# Patient Record
Sex: Female | Born: 1985 | Race: Black or African American | Hispanic: No | Marital: Single | Smoking: Current every day smoker
Health system: Southern US, Community
[De-identification: ages and names within clinical notes are randomized; demographics above are authoritative.]

## PROBLEM LIST (undated history)

## (undated) DIAGNOSIS — E669 Obesity, unspecified: Secondary | ICD-10-CM

---

## 2004-05-19 ENCOUNTER — Emergency Department: Payer: Self-pay | Admitting: Emergency Medicine

## 2006-11-02 ENCOUNTER — Emergency Department: Payer: Self-pay | Admitting: Emergency Medicine

## 2007-01-04 ENCOUNTER — Emergency Department (HOSPITAL_COMMUNITY): Admission: EM | Admit: 2007-01-04 | Discharge: 2007-01-04 | Payer: Self-pay | Admitting: Emergency Medicine

## 2007-01-05 ENCOUNTER — Emergency Department (HOSPITAL_COMMUNITY): Admission: EM | Admit: 2007-01-05 | Discharge: 2007-01-05 | Payer: Self-pay | Admitting: Emergency Medicine

## 2007-11-08 ENCOUNTER — Emergency Department: Payer: Self-pay | Admitting: Emergency Medicine

## 2014-04-25 ENCOUNTER — Encounter (HOSPITAL_COMMUNITY): Payer: Self-pay | Admitting: Emergency Medicine

## 2014-04-25 ENCOUNTER — Emergency Department (HOSPITAL_COMMUNITY)
Admission: EM | Admit: 2014-04-25 | Discharge: 2014-04-26 | Disposition: A | Discharge status: Home / Self Care | Payer: Self-pay | Attending: Emergency Medicine | Admitting: Emergency Medicine

## 2014-04-25 DIAGNOSIS — Z720 Tobacco use: Secondary | ICD-10-CM | POA: Insufficient documentation

## 2014-04-25 DIAGNOSIS — D649 Anemia, unspecified: Secondary | ICD-10-CM | POA: Insufficient documentation

## 2014-04-25 DIAGNOSIS — N939 Abnormal uterine and vaginal bleeding, unspecified: Secondary | ICD-10-CM | POA: Insufficient documentation

## 2014-04-25 LAB — CBC
HEMATOCRIT: 36.8 % (ref 36.0–46.0)
Hemoglobin: 11.7 g/dL — ABNORMAL LOW (ref 12.0–15.0)
MCH: 28.7 pg (ref 26.0–34.0)
MCHC: 31.8 g/dL (ref 30.0–36.0)
MCV: 90.4 fL (ref 78.0–100.0)
PLATELETS: 258 10*3/uL (ref 150–400)
RBC: 4.07 MIL/uL (ref 3.87–5.11)
RDW: 18.2 % — AB (ref 11.5–15.5)
WBC: 11.1 10*3/uL — ABNORMAL HIGH (ref 4.0–10.5)

## 2014-04-25 LAB — I-STAT BETA HCG BLOOD, ED (MC, WL, AP ONLY): I-stat hCG, quantitative: <5 m[IU]/mL (ref <5)

## 2014-04-25 MED ORDER — KETOROLAC TROMETHAMINE 60 MG/2ML IM SOLN
60.0000 mg | Freq: Once | INTRAMUSCULAR | Status: AC
  Administered 2014-04-25: 60 mg via INTRAMUSCULAR
  Filled 2014-04-25: qty 2

## 2014-04-25 NOTE — ED Notes (Signed)
Patient states that she has been bleeding since April 2, with large clots.  Patient states that she does not know if she is pregnant at this time.  Patient states that she is also having cramping with the bleeding.

## 2014-04-25 NOTE — ED Provider Notes (Signed)
CSN: [id number]     Arrival date & time 04/25/14  2109 History   First MD Initiated Contact with Patient 04/25/14 2229     Chief Complaint  Patient presents with  . Vaginal Bleeding     (Consider location/radiation/quality/duration/timing/severity/associated sxs/prior Treatment) HPI  Monica Mcbride is a 29 y.o. female with PMH of AUB presenting with vaginal bleeding for 2 weeks. Patient states she is passing large clots and having to change her pads frequently. Patient states she also has intermittent lower abdominal cramping but denies any abdominal pain at this time. She denies any nausea or vomiting. Last BM yesterday and normal. She denies any urinary symptoms. Patient denies any vaginal discharge. Patient states she has intercourse and does not use condoms. She hasn't noted any vaginal discharge other than bleeding.   History reviewed. No pertinent past medical history. History reviewed. No pertinent past surgical history. No family history on file. History  Substance Use Topics  . Smoking status: Current Every Day Smoker  . Smokeless tobacco: Not on file  . Alcohol Use: Yes   OB History    No data available     Review of Systems 10 Systems reviewed and are negative for acute change except as noted in the HPI.    Allergies  Review of patient's allergies indicates no known allergies.  Home Medications   Prior to Admission medications   Medication Sig Start Date End Date Taking? Authorizing Provider  medroxyPROGESTERone (PROVERA) 5 MG tablet Take 2 tablets (10 mg total) by mouth daily. 04/26/14   Oswaldo ConroyVictoria Brealynn Contino, PA-C   BP 119/79 mmHg  Pulse 83  Temp(Src) 98.6 F (37 C)  Resp 20  Ht 5\' 7"  (1.702 m)  Wt 325 lb (147.419 kg)  BMI 50.89 kg/m2  SpO2 100%  LMP 03/15/2014 (Exact Date) Physical Exam  Constitutional: She appears well-developed and well-nourished. No distress.  Morbidly obese female in no acute distress.  HENT:  Head: Normocephalic and atraumatic.   Mouth/Throat: Oropharynx is clear and moist.  Eyes: Conjunctivae and EOM are normal. Right eye exhibits no discharge. Left eye exhibits no discharge.  Cardiovascular: Normal rate and regular rhythm.   Pulmonary/Chest: Effort normal and breath sounds normal. No respiratory distress. She has no wheezes.  Abdominal: Soft. Bowel sounds are normal. She exhibits no distension. There is no tenderness.  Genitourinary:  External genitalia without erythema, tenderness, lesions. Cervix pink without lesions. Os closed. No CMT. No right or left adnexal tenderness. No adnexal masses appreciated. Copious bloody discharge that accumulates rapidly discharge without foul odor. Nurse in room for exam.  Neurological: She is alert. She exhibits normal muscle tone. Coordination normal.  Skin: Skin is warm and dry. She is not diaphoretic.  Nursing note and vitals reviewed.   ED Course  Procedures (including critical care time) Labs Review Labs Reviewed  CBC - Abnormal; Notable for the following:    WBC 11.1 (*)    Hemoglobin 11.7 (*)    RDW 18.2 (*)    All other components within normal limits  WET PREP, GENITAL  I-STAT BETA HCG BLOOD, ED (MC, WL, AP ONLY)  GC/CHLAMYDIA PROBE AMP ([place])    Imaging Review No results found.   EKG Interpretation None      MDM   Final diagnoses:  Vaginal bleeding  Anemia, unspecified anemia type   No abdominal tenderness on exam. Patient's vitals stable. Patient pelvic with copious amounts of vaginal bleeding that reaccumulate rapidly forming clots. No significant tenderness. Wet prep with  few white blood cells. I doubt PID. Patient's hemoglobin 11.1 G denies any chest pain or shortness of breath or lightheadedness with standing. Patient given prescription for Provera and to follow-up closely with [facility].  Discussed return precautions with patient. Discussed all results and patient verbalizes understanding and agrees with plan.  Case has been  discussed with Dr. Judd Lien who agrees with the above plan and to discharge.     Oswaldo Conroy, PA-C 04/26/14 1610  Geoffery Lyons, MD 04/26/14 [id number]

## 2014-04-26 LAB — WET PREP, GENITAL
CLUE CELLS WET PREP: NONE SEEN
TRICH WET PREP: NONE SEEN
YEAST WET PREP: NONE SEEN

## 2014-04-26 LAB — GC/CHLAMYDIA PROBE AMP (~~LOC~~) NOT AT ARMC
Chlamydia: NEGATIVE
Neisseria Gonorrhea: NEGATIVE

## 2014-04-26 MED ORDER — MEDROXYPROGESTERONE ACETATE 5 MG PO TABS: 10.0000 mg | ORAL_TABLET | Freq: Every day | ORAL | Status: DC

## 2014-04-26 NOTE — Discharge Instructions (Signed)
Return to the emergency room with worsening of symptoms, new symptoms or with symptoms that are concerning , especially fevers, abdominal pain in one area, unable to keep down fluids, blood in stool or vomit, severe pain, you feel faint, lightheaded or pass out. Please call your doctor for a followup appointment within 24-48 hours. When you talk to your doctor please let them know that you were seen in the emergency department and have them acquire all of your records so that they can discuss the findings with you and formulate a treatment plan to fully care for your new and ongoing problems. If you do not have a primary care provider please call the number below under ED resources to establish care with a provider and follow up.  Read below information and follow recommendations.  Abnormal Uterine Bleeding Abnormal uterine bleeding can affect women at various stages in life, including teenagers, women in their reproductive years, pregnant women, and women who have reached menopause. Several kinds of uterine bleeding are considered abnormal, including:  Bleeding or spotting between periods.   Bleeding after sexual intercourse.   Bleeding that is heavier or more than normal.   Periods that last longer than usual.  Bleeding after menopause.  Many cases of abnormal uterine bleeding are minor and simple to treat, while others are more serious. Any type of abnormal bleeding should be evaluated by your health care provider. Treatment will depend on the cause of the bleeding. HOME CARE INSTRUCTIONS Monitor your condition for any changes. The following actions may help to alleviate any discomfort you are experiencing:  Avoid the use of tampons and douches as directed by your health care provider.  Change your pads frequently. You should get regular pelvic exams and Pap tests. Keep all follow-up appointments for diagnostic tests as directed by your health care provider.  SEEK MEDICAL CARE IF:    Your bleeding lasts more than 1 week.   You feel dizzy at times.  SEEK IMMEDIATE MEDICAL CARE IF:   You pass out.   You are changing pads every 15 to 30 minutes.   You have abdominal pain.  You have a fever.   You become sweaty or weak.   You are passing large blood clots from the vagina.   You start to feel nauseous and vomit. MAKE SURE YOU:   Understand these instructions.  Will watch your condition.  Will get help right away if you are not doing well or get worse. Document Released: 12/23/2004 Document Revised: 12/28/2012 Document Reviewed: 07/22/2012 [facility] Patient Information 2015 [place], [place]. This information is not intended to replace advice given to you by your health care provider. Make sure you discuss any questions you have with your health care provider.    Emergency Department Resource Guide 1) Find a Doctor and Pay Out of Pocket Although you won't have to find out who is covered by your insurance plan, it is a good idea to ask around and get recommendations. You will then need to call the office and see if the doctor you have chosen will accept you as a new patient and what types of options they offer for patients who are self-pay. Some doctors offer discounts or will set up payment plans for their patients who do not have insurance, but you will need to ask so you aren't surprised when you get to your appointment.  2) Contact Your Local Health Department Not all health departments have doctors that can see patients for sick visits, but many  do, so it is worth a call to see if yours does. If you don't know where your local health department is, you can check in your phone book. The CDC also has a tool to help you locate your state's health department, and many state websites also have listings of all of their local health departments.  3) Find a Walk-in Clinic If your illness is not likely to be very severe or complicated, you may want to try a  walk in clinic. These are popping up all over the country in pharmacies, drugstores, and shopping centers. They're usually staffed by nurse practitioners or physician assistants that have been trained to treat common illnesses and complaints. They're usually fairly quick and inexpensive. However, if you have serious medical issues or chronic medical problems, these are probably not your best option.  No Primary Care Doctor: - Call Health Connect at  [phone] - they can help you locate a primary care doctor that  accepts your insurance, provides certain services, etc. - Physician Referral Service- [phone]  Chronic Pain Problems: Organization         Address  Phone   Notes  [facility]  [phone] Patients need to be referred by their primary care doctor.   Medication Assistance: Organization         Address  Phone   Notes  [facility] Medication [facility] [address] [place], [place] [address] [phone](336) [phone] --Must be a resident of [facility] [place] -- Must have NO insurance coverage whatsoever (no Medicaid/ Medicare, etc.) -- The pt. MUST have a primary care doctor that directs their care regularly and follows them in the community   MedAssist  [phone]   [employer]  [phone]) [phone]    Agencies that provide inexpensive medical care: Organization         Address  Phone   Notes  [facility]  [phone]) [phone]   [facility]    [phone]   [facility] [address] [place], [place] [address] [phone](336) [phone]   [facility] [address], [place] [phone](336) [phone]   Planned Parenthood    [phone](336) [phone]   [facility]    [phone]   [facility]  [address], [place] Phone:  [phone], Fax:  [phone] Hours of Operation:  9 am - 6 pm, M-F.  Also accepts Medicaid/Medicare and self-pay.  [facility]  [address], [place] Phone: [phone](336) [phone], Fax: [phone](336) [phone]. Hours of Operation:  8:30 am - 5:30 pm, M-F.  Also accepts Medicaid and self-pay.  [facility] [place] [address], [place] Phone: [phone]) [phone]   [facility] [address], [place], [place] [phone], Ext. [phone] Mondays & Thursdays: 7-9 AM.  First 15 patients are seen on a first come, first serve basis.    Medicaid-accepting [facility] Providers:  Organization         Address  Phone   Notes  [facility] [address], [place] [phone]) [phone] Also accepts self-pay patients.  [facility] [address], [place]  [phone]) [phone]   [facility] [address], [place] [phone]) [phone]   [facility] [address] [place] [address] [place] [address], [place] [phone]) [phone]   Renaye RakersVeita Bland  [address], [place]   [phone] Only accepts [place] patients after they have their name applied to their card.   Self-Pay (no insurance) in [facility]:  Organization         Address  Phone   Notes  Sickle Cell Patients, [facility] [address] [place], [place] [phone]   [facility] [address] [place], [place] [phone]   [facility]  [address], [place] [phone]   [facility]/Dr. Osei-Bonsu  [address] [place] or [address], [place] [phone] Phone number for both [place] and [place] locations is the same.  [facility] [address], [place] [phone]   [facility] [address] [place] or [address] [phone]   [facility] [address] [place] [phone], phone; [phone], fax Sees  patients 1st and 3rd Saturday of every month.  Must not qualify for public or private insurance (i.e. Medicaid, Medicare, [place] Health Choice, Veterans' Benefits)  Household income should be no more than 200% of the poverty level The clinic cannot treat you if you are pregnant or think you are pregnant  Sexually transmitted diseases are not treated at the clinic.    Dental Care: Organization         Address  Phone  Notes  [facility] [address] [place], [place] [phone] Accepts children up to age 16 who are enrolled in [place] or [place] Health Choice; pregnant women with a Medicaid card; and children who have applied for Medicaid or [place] Health Choice, but were declined, whose parents can pay a reduced fee at time of service.  [facility] Department of [facility]  [address], [place] [phone] Accepts children up to age 48 who are enrolled in [place] or [place] Health Choice; pregnant women with a Medicaid card; and children who have applied for Medicaid or  Health Choice, but were declined, whose parents can pay a reduced fee at time of service.  [facility]  [address] [place], [place] [phone] Patients are seen by appointment only. Walk-ins are not accepted. [facility] will see patients 57 years of age and older. Monday - Tuesday (8am-5pm) Most Wednesdays (8:30-5pm) $30 per visit, cash only  [facility] Adult Dental Access PROGRAM  [address], [facility] [phone] Patients are seen by appointment only. Walk-ins are not accepted. [facility] will see patients 44 years of age and older. One Wednesday Evening (Monthly: Volunteer Based).  $30 per visit, cash only  [employer]  [phone] for adults; Children under age 66, call Graduate Pediatric Dentistry at [phone]. Children aged 14-14, please call [phone] to request a  pediatric application.  Dental services are provided in all areas of dental care including fillings, crowns and bridges, complete and partial dentures, implants, gum treatment, root canals, and extractions. Preventive care is also provided. Treatment is provided to both adults and children. Patients are selected via a lottery and there is often a waiting list.   [facility] [address] [place]  [phone] [email or web address]   [facility] [address] [place], [place] [phone], Ext. [phone] Second  and Fourth Thursday of each month, opens at 6:30 AM; Clinic ends at 9 AM.  Patients are seen on a first-come first-served basis, and a limited number are seen during each clinic.   [facility]  [address] [place] [place], [place] [phone]   Eligibility Requirements You must have lived in [place], [place], or [place] counties for at least the last three months.   You cannot be eligible for state or federal sponsored [place], including [employer], [place], or [employer].   You generally cannot be eligible for healthcare insurance through your employer.    How to apply: Eligibility screenings are held every Tuesday and Wednesday afternoon from 1:00 pm until 4:00 pm. You do not need an appointment for the interview!  [facility] [address], [place], [place] [phone]   [facility] Health Department  [phone]   [facility] Health Department  [phone]   [facility] Health Department  [phone]    Behavioral Health Resources in the Community: Intensive Outpatient Programs Organization         Address  Phone  Notes  [facility] [address] [place], [place] [phone]   [facility] Outpatient [address] [place], [place] [phone]   [facility]: [facility] [address], [place], [place]  [phone]   [facility]  Mental Health [address]  [place], [place] [phone] or [phone]   Substance Abuse Resources Organization         Address  Phone  Notes  [facility] Drug Services  [phone]   [facility]  [phone]   [facility]  [phone]   Floydene Flock  [phone]   Residential & Outpatient Substance Abuse Program  [phone]   Psychological Services Organization         Address  Phone  Notes  [facility] Behavioral Health  [phone]   [facility]  [phone]   [facility] Mental Health [address], [place] [phone] or [phone]    Mobile Crisis Teams Organization         Address  Phone  Notes  [facility], Mobile Crisis Care Unit  [phone]   [facility]  [address] [place], [place] [phone]   Doristine Locks [address] [place] [phone]    Self-Help/Support Groups Organization         Address  Phone             Notes  [facility]oc[facility] of [place] - variety of support groups  336- I7437963 Call for more information  Narcotics Anonymous (NA), [facility] [address], [employer] [place]  2 meetings at this location   Statistician         Address  Phone  Notes  [facility] [address],    [place]  [phone]   [facility]  [address] [place] [address], [place], [place] [phone]   [facility] [address] [place], [place] [phone] Admissions: 8am-3pm M-F  [facility] [address]    [place], [place] [phone]   [facility] [address] [place], [place] [phone]   [facility] [address],  [place], [place] [phone]   [facility] - Intensive Outpatient [address], [place], [place] [phone]   [facility] ([facility] [phone] [place]  [address] [place]    [place], [place] [phone] or [phone]   [facility] ([facility]) [address], [place], [place] [phone] Accepts Medicaid  [facility] [place] [address]  [place] [phone] Substance Abuse/Addiction Treatment   [facility] Organization         Address  Phone  Notes  [facility]  [phone]   Angie Fava, PhD [address] Kn[address] [place], [place]   [phone] or 3476189691   [facility] Behavioral   [address] [place], [place] [phone]   [facility] [address] [place], [place] [phone] Insurance/Medicaid/sponsorship through [facility] and Families [address]                                    [place], [place] [phone] Therapy/tele-psych/case  [facility] [address], [place] [phone]    Dr. Lolly Mustache  [phone]   [facility] [place]  [employer] [facility] Dept. 1) [address], [place] 2) [address] [place] 3)  [address], [place] [phone]  [phone]   [facility] Child Abuse Hotline 650-267-0901 or 854-387-9831 (After Hours)

## 2014-05-09 ENCOUNTER — Emergency Department (HOSPITAL_COMMUNITY)
Admission: EM | Admit: 2014-05-09 | Discharge: 2014-05-09 | Discharge status: Against Medical Advice | Payer: Self-pay | Attending: Emergency Medicine | Admitting: Emergency Medicine

## 2014-05-09 ENCOUNTER — Encounter (HOSPITAL_COMMUNITY): Payer: Self-pay | Admitting: *Deleted

## 2014-05-09 DIAGNOSIS — R1030 Lower abdominal pain, unspecified: Secondary | ICD-10-CM | POA: Insufficient documentation

## 2014-05-09 DIAGNOSIS — E669 Obesity, unspecified: Secondary | ICD-10-CM | POA: Insufficient documentation

## 2014-05-09 DIAGNOSIS — M545 Low back pain: Secondary | ICD-10-CM | POA: Insufficient documentation

## 2014-05-09 DIAGNOSIS — N939 Abnormal uterine and vaginal bleeding, unspecified: Secondary | ICD-10-CM | POA: Insufficient documentation

## 2014-05-09 DIAGNOSIS — Z720 Tobacco use: Secondary | ICD-10-CM | POA: Insufficient documentation

## 2014-05-09 LAB — COMPREHENSIVE METABOLIC PANEL
ALT: 19 U/L (ref 14–54)
ANION GAP: 10 (ref 5–15)
AST: 15 U/L (ref 15–41)
Albumin: 3.3 g/dL — ABNORMAL LOW (ref 3.5–5.0)
Alkaline Phosphatase: 79 U/L (ref 38–126)
BILIRUBIN TOTAL: 0.5 mg/dL (ref 0.3–1.2)
BUN: 7 mg/dL (ref 6–20)
CO2: 25 mmol/L (ref 22–32)
Calcium: 8.8 mg/dL — ABNORMAL LOW (ref 8.9–10.3)
Chloride: 103 mmol/L (ref 101–111)
Creatinine, Ser: 0.82 mg/dL (ref 0.44–1.00)
GFR calc Af Amer: >60 mL/min (ref >60)
GFR calc non Af Amer: >60 mL/min (ref >60)
GLUCOSE: 100 mg/dL — AB (ref 70–99)
POTASSIUM: 3.9 mmol/L (ref 3.5–5.1)
SODIUM: 138 mmol/L (ref 135–145)
Total Protein: 7.3 g/dL (ref 6.5–8.1)

## 2014-05-09 LAB — CBC WITH DIFFERENTIAL/PLATELET
BASOS PCT: 0 % (ref 0–1)
Basophils Absolute: 0 10*3/uL (ref 0.0–0.1)
EOS ABS: 0.2 10*3/uL (ref 0.0–0.7)
Eosinophils Relative: 1 % (ref 0–5)
HCT: 35.6 % — ABNORMAL LOW (ref 36.0–46.0)
HEMOGLOBIN: 11.2 g/dL — AB (ref 12.0–15.0)
LYMPHS ABS: 3.6 10*3/uL (ref 0.7–4.0)
Lymphocytes Relative: 27 % (ref 12–46)
MCH: 27.7 pg (ref 26.0–34.0)
MCHC: 31.5 g/dL (ref 30.0–36.0)
MCV: 88.1 fL (ref 78.0–100.0)
Monocytes Absolute: 0.7 10*3/uL (ref 0.1–1.0)
Monocytes Relative: 5 % (ref 3–12)
NEUTROS ABS: 9 10*3/uL — AB (ref 1.7–7.7)
NEUTROS PCT: 67 % (ref 43–77)
PLATELETS: 280 10*3/uL (ref 150–400)
RBC: 4.04 MIL/uL (ref 3.87–5.11)
RDW: 17.6 % — ABNORMAL HIGH (ref 11.5–15.5)
WBC: 13.5 10*3/uL — AB (ref 4.0–10.5)

## 2014-05-09 NOTE — ED Notes (Signed)
Pt reports bleeding since April second. Pt states that she has been seen for the same about 2 weeks ago for same. Pt states that she is bleeding through 2-4 tampons a day. Pt states that she is having lower abdominal pain and lower back pain. Pt reports passing large clots.

## 2014-05-09 NOTE — ED Notes (Signed)
[facility]  She did not want to wait.

## 2014-05-10 LAB — I-STAT BETA HCG BLOOD, ED (MC, WL, AP ONLY): I-stat hCG, quantitative: <5 m[IU]/mL (ref <5)

## 2014-05-12 ENCOUNTER — Inpatient Hospital Stay (HOSPITAL_COMMUNITY)
Admission: AD | Admit: 2014-05-12 | Discharge: 2014-05-13 | Disposition: A | Discharge status: Home / Self Care | Payer: Self-pay | Source: Ambulatory Visit | Attending: Obstetrics & Gynecology | Admitting: Obstetrics & Gynecology

## 2014-05-12 DIAGNOSIS — N939 Abnormal uterine and vaginal bleeding, unspecified: Secondary | ICD-10-CM

## 2014-05-12 DIAGNOSIS — R1031 Right lower quadrant pain: Secondary | ICD-10-CM

## 2014-05-12 DIAGNOSIS — G8929 Other chronic pain: Secondary | ICD-10-CM

## 2014-05-12 DIAGNOSIS — R1032 Left lower quadrant pain: Secondary | ICD-10-CM

## 2014-05-12 DIAGNOSIS — R109 Unspecified abdominal pain: Secondary | ICD-10-CM | POA: Insufficient documentation

## 2014-05-12 DIAGNOSIS — D649 Anemia, unspecified: Secondary | ICD-10-CM

## 2014-05-12 HISTORY — DX: Obesity, unspecified: E66.9

## 2014-05-13 ENCOUNTER — Encounter (HOSPITAL_COMMUNITY): Payer: Self-pay | Admitting: *Deleted

## 2014-05-13 LAB — CBC
HCT: 33.3 % — ABNORMAL LOW (ref 36.0–46.0)
HEMOGLOBIN: 10.5 g/dL — AB (ref 12.0–15.0)
MCH: 28.3 pg (ref 26.0–34.0)
MCHC: 31.5 g/dL (ref 30.0–36.0)
MCV: 89.8 fL (ref 78.0–100.0)
PLATELETS: 260 10*3/uL (ref 150–400)
RBC: 3.71 MIL/uL — AB (ref 3.87–5.11)
RDW: 18.1 % — AB (ref 11.5–15.5)
WBC: 11.8 10*3/uL — AB (ref 4.0–10.5)

## 2014-05-13 LAB — URINALYSIS, ROUTINE W REFLEX MICROSCOPIC
BILIRUBIN URINE: NEGATIVE
GLUCOSE, UA: NEGATIVE mg/dL
KETONES UR: NEGATIVE mg/dL
Leukocytes, UA: NEGATIVE
Nitrite: NEGATIVE
PH: 5.5 (ref 5.0–8.0)
PROTEIN: NEGATIVE mg/dL
Specific Gravity, Urine: 1.02 (ref 1.005–1.030)
Urobilinogen, UA: 0.2 mg/dL (ref 0.0–1.0)

## 2014-05-13 LAB — URINE MICROSCOPIC-ADD ON

## 2014-05-13 LAB — POCT PREGNANCY, URINE: PREG TEST UR: NEGATIVE

## 2014-05-13 MED ORDER — MEGESTROL ACETATE 20 MG PO TABS: 20.0000 mg | ORAL_TABLET | Freq: Every day | ORAL | Status: AC

## 2014-05-13 MED ORDER — HYDROCODONE-ACETAMINOPHEN 5-325 MG PO TABS: 1.0000 | ORAL_TABLET | ORAL | Status: DC | PRN

## 2014-05-13 MED ORDER — KETOROLAC TROMETHAMINE 60 MG/2ML IM SOLN: 60.0000 mg | Freq: Once | INTRAMUSCULAR | Status: DC

## 2014-05-13 NOTE — Discharge Instructions (Signed)

## 2014-05-13 NOTE — MAU Note (Addendum)
Past 2 months period very light. [facility] MD at [facility] and discussed my weight. Followed diet we discussed. Had regular period first of April but have kept bleeding. Abd cramping. Extreme back pain. Given progesterone and clots stopped and bleeding slowed some. Cont. To have bleeding. Using half pack tampons and half pack pads daily.

## 2014-05-13 NOTE — MAU Provider Note (Signed)
History     CSN: [id number]  Arrival date and time: 05/12/14 2326   First Provider Initiated Contact with Patient 05/13/14 0120      Chief Complaint  Patient presents with  . Back Pain  . Abdominal Cramping   HPI   Monica Mcbride is a 29 y.o. female G1P0 at Unknown who presents to MAU with complaints of heavy vaginal bleeding. She was seen at [facility] in April for the same symptoms. She was given 10 days of provera and states this did not help. She is going through 12-14 tampons per day, and has been going through this many for 5 weeks. She is bleeding all day, most days.   She also complains of abdominal pain; bilateral lower abdominal pain that radiates to her lower back. She has tried ibuprofen with minimal relief, she was given oxycodone and feels this helped the most. She has had this pain for 5 weeks, since the bleeding started.   Patient states she [place] for [place] in [place]; patient works for [employer], says she has not used a lot of narcotics in the past.  She says that she [facility] for [facility], however here for work.   OB History    Gravida Para Term Preterm AB TAB SAB Ectopic Multiple Living   1         1      Past Medical History  Diagnosis Date  . Obesity     Past Surgical History  Procedure Laterality Date  . Cesarean section      History reviewed. No pertinent family history.  History  Substance Use Topics  . Smoking status: Current Every Day Smoker -- 1.00 packs/day for 15 years    Types: Cigarettes  . Smokeless tobacco: Not on file  . Alcohol Use: Yes    Allergies: No Known Allergies  Prescriptions prior to admission  Medication Sig Dispense Refill Last Dose  . medroxyPROGESTERone (PROVERA) 5 MG tablet Take 2 tablets (10 mg total) by mouth daily. (Patient not taking: Reported on 05/09/2014) 20 tablet 0 Not Taking at Unknown time   Results for orders placed or performed during the hospital encounter of 05/12/14  (from the past 48 hour(s))  Urinalysis, Routine w reflex microscopic     Status: Abnormal   Collection Time: 05/13/14 12:15 AM  Result Value Ref Range   Color, Urine YELLOW YELLOW   APPearance CLEAR CLEAR   Specific Gravity, Urine 1.020 1.005 - 1.030   pH 5.5 5.0 - 8.0   Glucose, UA NEGATIVE NEGATIVE mg/dL   Hgb urine dipstick TRACE (A) NEGATIVE   Bilirubin Urine NEGATIVE NEGATIVE   Ketones, ur NEGATIVE NEGATIVE mg/dL   Protein, ur NEGATIVE NEGATIVE mg/dL   Urobilinogen, UA 0.2 0.0 - 1.0 mg/dL   Nitrite NEGATIVE NEGATIVE   Leukocytes, UA NEGATIVE NEGATIVE  Urine microscopic-add on     Status: None   Collection Time: 05/13/14 12:15 AM  Result Value Ref Range   Squamous Epithelial / LPF RARE RARE   WBC, UA 0-2 <3 WBC/hpf   RBC / HPF 0-2 <3 RBC/hpf   Bacteria, UA RARE RARE   Urine-Other MUCOUS PRESENT   CBC     Status: Abnormal   Collection Time: 05/13/14 12:30 AM  Result Value Ref Range   WBC 11.8 (H) 4.0 - 10.5 K/uL   RBC 3.71 (L) 3.87 - 5.11 MIL/uL   Hemoglobin 10.5 (L) 12.0 - 15.0 g/dL   HCT  33.3 (L) 36.0 - 46.0 %   MCV 89.8 78.0 - 100.0 fL   MCH 28.3 26.0 - 34.0 pg   MCHC 31.5 30.0 - 36.0 g/dL   RDW 16.118.1 (H) 09.611.5 - 04.515.5 %   Platelets 260 150 - 400 K/uL  Pregnancy, urine POC     Status: None   Collection Time: 05/13/14 12:36 AM  Result Value Ref Range   Preg Test, Ur NEGATIVE NEGATIVE    Comment:        THE SENSITIVITY OF THIS METHODOLOGY IS >24 mIU/mL     Review of Systems  Constitutional: Negative for fever and chills.  Gastrointestinal: Positive for abdominal pain. Negative for nausea and vomiting.   Physical Exam   Blood pressure 138/72, pulse 84, temperature 98.2 F (36.8 C), resp. rate 22, height 5\' 7"  (1.702 m), weight 205.479 kg (453 lb), last menstrual period 04/08/2014.  Physical Exam  Constitutional: She is oriented to person, place, and time. She appears well-developed and well-nourished.  Obese  Respiratory: Effort normal.  GI: Soft. There is  no tenderness.  Genitourinary:  Speculum exam: Vagina - Small amount of dark red blood, none pooling.  Cervix - + small amount of active bleeding Bimanual exam: Cervix closed Uterus non tender, normal size Adnexa non tender, no masses bilaterally Chaperone present for exam.  Musculoskeletal: Normal range of motion.  Neurological: She is alert and oriented to person, place, and time.  Skin: Skin is warm.  Psychiatric: Her behavior is normal.    MAU Course  Procedures  MDM  Patient refused toradol   Patient requests oxycodone for pain.   Per care everywhere patient has [facility] with chronic [facility].   Assessment and Plan   A:  1. Abnormal vaginal bleeding   2. Anemia, unspecified anemia type   3. Abdominal pain, chronic, bilateral lower quadrant     P:  Discharge home in stable condition RX: Megace taper        Vicodin #6 No refill  Patient given [facility] phone number Pelvic US scheduled outpatient; US will call to schedule  Bleeding precautions.  Over the counter Iron as directed on the bottle   Duane LopeJennifer I Rocky Rishel, NP 05/13/2014 2:42 AM

## 2014-05-16 ENCOUNTER — Other Ambulatory Visit (HOSPITAL_COMMUNITY): Payer: Self-pay | Admitting: Obstetrics and Gynecology

## 2014-05-16 DIAGNOSIS — N939 Abnormal uterine and vaginal bleeding, unspecified: Secondary | ICD-10-CM

## 2014-05-19 ENCOUNTER — Ambulatory Visit (HOSPITAL_COMMUNITY): Admission: RE | Admit: 2014-05-19 | Payer: Self-pay | Source: Ambulatory Visit

## 2014-10-10 ENCOUNTER — Encounter (HOSPITAL_COMMUNITY): Payer: Self-pay | Admitting: Emergency Medicine

## 2014-10-10 ENCOUNTER — Emergency Department (HOSPITAL_COMMUNITY): Payer: No Typology Code available for payment source

## 2014-10-10 ENCOUNTER — Emergency Department (HOSPITAL_COMMUNITY)
Admission: EM | Admit: 2014-10-10 | Discharge: 2014-10-10 | Discharge status: Against Medical Advice | Payer: No Typology Code available for payment source | Attending: Emergency Medicine | Admitting: Emergency Medicine

## 2014-10-10 DIAGNOSIS — Z3202 Encounter for pregnancy test, result negative: Secondary | ICD-10-CM | POA: Insufficient documentation

## 2014-10-10 DIAGNOSIS — S301XXA Contusion of abdominal wall, initial encounter: Secondary | ICD-10-CM | POA: Diagnosis not present

## 2014-10-10 DIAGNOSIS — Y998 Other external cause status: Secondary | ICD-10-CM | POA: Diagnosis not present

## 2014-10-10 DIAGNOSIS — S63619A Unspecified sprain of unspecified finger, initial encounter: Secondary | ICD-10-CM

## 2014-10-10 DIAGNOSIS — S63610A Unspecified sprain of right index finger, initial encounter: Secondary | ICD-10-CM | POA: Insufficient documentation

## 2014-10-10 DIAGNOSIS — S3992XA Unspecified injury of lower back, initial encounter: Secondary | ICD-10-CM | POA: Insufficient documentation

## 2014-10-10 DIAGNOSIS — E669 Obesity, unspecified: Secondary | ICD-10-CM | POA: Diagnosis not present

## 2014-10-10 DIAGNOSIS — Z720 Tobacco use: Secondary | ICD-10-CM | POA: Insufficient documentation

## 2014-10-10 DIAGNOSIS — Y92410 Unspecified street and highway as the place of occurrence of the external cause: Secondary | ICD-10-CM | POA: Insufficient documentation

## 2014-10-10 DIAGNOSIS — S3991XA Unspecified injury of abdomen, initial encounter: Secondary | ICD-10-CM | POA: Diagnosis present

## 2014-10-10 DIAGNOSIS — Y9389 Activity, other specified: Secondary | ICD-10-CM | POA: Insufficient documentation

## 2014-10-10 LAB — COMPREHENSIVE METABOLIC PANEL
ALBUMIN: 3.5 g/dL (ref 3.5–5.0)
ALT: 16 U/L (ref 14–54)
ANION GAP: 7 (ref 5–15)
AST: 16 U/L (ref 15–41)
Alkaline Phosphatase: 76 U/L (ref 38–126)
BILIRUBIN TOTAL: 0.5 mg/dL (ref 0.3–1.2)
BUN: 9 mg/dL (ref 6–20)
CALCIUM: 8.8 mg/dL — AB (ref 8.9–10.3)
CO2: 28 mmol/L (ref 22–32)
Chloride: 104 mmol/L (ref 101–111)
Creatinine, Ser: 0.64 mg/dL (ref 0.44–1.00)
Glucose, Bld: 115 mg/dL — ABNORMAL HIGH (ref 65–99)
POTASSIUM: 3.6 mmol/L (ref 3.5–5.1)
Sodium: 139 mmol/L (ref 135–145)
TOTAL PROTEIN: 7.5 g/dL (ref 6.5–8.1)

## 2014-10-10 LAB — CBC
HEMATOCRIT: 37 % (ref 36.0–46.0)
HEMOGLOBIN: 11.4 g/dL — AB (ref 12.0–15.0)
MCH: 26.1 pg (ref 26.0–34.0)
MCHC: 30.8 g/dL (ref 30.0–36.0)
MCV: 84.7 fL (ref 78.0–100.0)
Platelets: 265 10*3/uL (ref 150–400)
RBC: 4.37 MIL/uL (ref 3.87–5.11)
RDW: 20 % — ABNORMAL HIGH (ref 11.5–15.5)
WBC: 9.7 10*3/uL (ref 4.0–10.5)

## 2014-10-10 LAB — URINALYSIS, ROUTINE W REFLEX MICROSCOPIC
BILIRUBIN URINE: NEGATIVE
Glucose, UA: NEGATIVE mg/dL
Hgb urine dipstick: NEGATIVE
Ketones, ur: NEGATIVE mg/dL
Leukocytes, UA: NEGATIVE
NITRITE: NEGATIVE
PROTEIN: NEGATIVE mg/dL
SPECIFIC GRAVITY, URINE: 1.015 (ref 1.005–1.030)
UROBILINOGEN UA: 1 mg/dL (ref 0.0–1.0)
pH: 7 (ref 5.0–8.0)

## 2014-10-10 LAB — I-STAT BETA HCG BLOOD, ED (MC, WL, AP ONLY)

## 2014-10-10 MED ORDER — SODIUM CHLORIDE 0.9 % IV BOLUS (SEPSIS)
500.0000 mL | Freq: Once | INTRAVENOUS | Status: AC
  Administered 2014-10-10: 500 mL via INTRAVENOUS

## 2014-10-10 MED ORDER — IOHEXOL 300 MG/ML  SOLN: 100.0000 mL | Freq: Once | INTRAMUSCULAR | Status: DC | PRN
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
## 2015-05-03 ENCOUNTER — Encounter (HOSPITAL_COMMUNITY): Payer: Self-pay | Admitting: Emergency Medicine

## 2015-05-03 ENCOUNTER — Emergency Department (HOSPITAL_COMMUNITY): Payer: Self-pay

## 2015-05-03 ENCOUNTER — Emergency Department (HOSPITAL_COMMUNITY)
Admission: EM | Admit: 2015-05-03 | Discharge: 2015-05-03 | Disposition: A | Discharge status: Home / Self Care | Payer: Self-pay | Attending: Emergency Medicine | Admitting: Emergency Medicine

## 2015-05-03 DIAGNOSIS — Z7982 Long term (current) use of aspirin: Secondary | ICD-10-CM | POA: Insufficient documentation

## 2015-05-03 DIAGNOSIS — F17210 Nicotine dependence, cigarettes, uncomplicated: Secondary | ICD-10-CM | POA: Insufficient documentation

## 2015-05-03 DIAGNOSIS — M545 Low back pain: Secondary | ICD-10-CM | POA: Insufficient documentation

## 2015-05-03 DIAGNOSIS — E669 Obesity, unspecified: Secondary | ICD-10-CM | POA: Insufficient documentation

## 2015-05-03 DIAGNOSIS — Z791 Long term (current) use of non-steroidal anti-inflammatories (NSAID): Secondary | ICD-10-CM | POA: Insufficient documentation

## 2015-05-03 DIAGNOSIS — M549 Dorsalgia, unspecified: Secondary | ICD-10-CM

## 2015-05-03 DIAGNOSIS — Z79899 Other long term (current) drug therapy: Secondary | ICD-10-CM | POA: Insufficient documentation

## 2015-05-03 DIAGNOSIS — Z79891 Long term (current) use of opiate analgesic: Secondary | ICD-10-CM | POA: Insufficient documentation

## 2015-05-03 LAB — I-STAT BETA HCG BLOOD, ED (MC, WL, AP ONLY): I-stat hCG, quantitative: <5 m[IU]/mL (ref <5)

## 2015-05-03 MED ORDER — HYDROCODONE-ACETAMINOPHEN 5-325 MG PO TABS: 1.0000 | ORAL_TABLET | ORAL | Status: DC | PRN

## 2015-05-03 MED ORDER — IBUPROFEN 600 MG PO TABS: 600.0000 mg | ORAL_TABLET | Freq: Four times a day (QID) | ORAL | Status: AC | PRN

## 2015-05-03 MED ORDER — METHOCARBAMOL 500 MG PO TABS
500.0000 mg | ORAL_TABLET | Freq: Once | ORAL | Status: AC
  Administered 2015-05-03: 500 mg via ORAL
  Filled 2015-05-03: qty 1

## 2015-05-03 MED ORDER — OXYCODONE-ACETAMINOPHEN 5-325 MG PO TABS
2.0000 | ORAL_TABLET | Freq: Once | ORAL | Status: AC
  Administered 2015-05-03: 2 via ORAL
  Filled 2015-05-03: qty 2

## 2015-05-03 MED ORDER — METHOCARBAMOL 500 MG PO TABS: 500.0000 mg | ORAL_TABLET | Freq: Two times a day (BID) | ORAL | Status: AC

## 2015-05-03 NOTE — ED Provider Notes (Signed)
CSN: [id number]     Arrival date & time 05/03/15  1411 History  By signing my name below, I, Freida Busmaniana Omoyeni, attest that this documentation has been prepared under the direction and in the presence of non-physician practitioner, Glean HessElizabeth Latrica Clowers, PA-C. Electronically Signed: Freida Busmaniana Omoyeni, Scribe. 05/03/2015. 2:30 PM.     Chief Complaint  Patient presents with  . Back Pain    The history is provided by the patient. No language interpreter was used.     HPI Comments:  Monica Mcbride is a 30 y.o. female with a history of obesity, who presents to the Emergency Department complaining of constant gradually worsening lower back pain x ~ 2 days. She notes her pain began after she stood from the couch ~ 2 days ago. She notes her pain was very mild at onset. She also describes a shooting pain in her back. She has taken ibuprofen, tramadol, and goody's without relief. Her pain is exacerbated with movement. She denies obvious injury and h/o similar pain. She also denies bowel/bladder incontinence, numbness/weakness, abdominal pain and vomiting.   Past Medical History  Diagnosis Date  . Obesity    Past Surgical History  Procedure Laterality Date  . Cesarean section     History reviewed. No pertinent family history. Social History  Substance Use Topics  . Smoking status: Current Every Day Smoker -- 1.00 packs/day for 15 years    Types: Cigarettes  . Smokeless tobacco: None  . Alcohol Use: Yes   OB History    Gravida Para Term Preterm AB TAB SAB Ectopic Multiple Living   1         1      Review of Systems  Gastrointestinal: Negative for vomiting and abdominal pain.  Musculoskeletal: Positive for back pain.  Neurological: Negative for weakness and numbness.    Allergies  Review of patient's allergies indicates no known allergies.  Home Medications   Prior to Admission medications   Medication Sig Start Date End Date Taking? Authorizing Provider   Aspirin-Acetaminophen-Caffeine (GOODYS EXTRA STRENGTH) 520-260-32.5 MG PACK Take 1 packet by mouth daily as needed. Headache.    Historical Provider, MD  HYDROcodone-acetaminophen (NORCO/VICODIN) 5-325 MG tablet Take 1-2 tablets by mouth every 4 (four) hours as needed. 05/03/15   Mady GemmaElizabeth C Dawnielle Christiana, PA-C  ibuprofen (ADVIL,MOTRIN) 600 MG tablet Take 1 tablet (600 mg total) by mouth every 6 (six) hours as needed. 05/03/15   Mady GemmaElizabeth C Elizabelle Fite, PA-C  megestrol (MEGACE) 20 MG tablet Take 1 tablet (20 mg total) by mouth daily. Take two tablets (40 mg) three times per day times three days,  then take two tablets (40 mg) two times per day times three days,  then take two tablets (40 mg)once per day Patient not taking: Reported on 10/10/2014 05/13/14   Duane LopeJennifer I Rasch, NP  methocarbamol (ROBAXIN) 500 MG tablet Take 1 tablet (500 mg total) by mouth 2 (two) times daily. 05/03/15   Dorise HissElizabeth C Haidan Nhan, PA-C    BP 150/88 mmHg  Pulse 81  Temp(Src) 97.8 F (36.6 C) (Oral)  Resp 18  SpO2 100%  LMP 04/18/2015 Physical Exam  Constitutional: She is oriented to person, place, and time. No distress.  Significantly obese female in no acute distress.  HENT:  Head: Normocephalic and atraumatic.  Right Ear: External ear normal.  Left Ear: External ear normal.  Nose: Nose normal.  Mouth/Throat: Uvula is midline, oropharynx is clear and moist and mucous membranes are normal.  Eyes: Conjunctivae, EOM and lids are  normal. Pupils are equal, round, and reactive to light. Right eye exhibits no discharge. Left eye exhibits no discharge. No scleral icterus.  Neck: Normal range of motion. Neck supple.  Cardiovascular: Normal rate, regular rhythm, normal heart sounds, intact distal pulses and normal pulses.   Pulmonary/Chest: Effort normal and breath sounds normal. No respiratory distress. She has no wheezes. She has no rales.  Abdominal: Soft. Normal appearance and bowel sounds are normal. She exhibits no distension  and no mass. There is no tenderness. There is no rigidity, no rebound and no guarding.  Musculoskeletal: Normal range of motion. She exhibits tenderness. She exhibits no edema.  TTP to lumbar spine and right lumbar paraspinal musculature; no palpable step off or deformity.  Neurological: She is alert and oriented to person, place, and time. She has normal strength and normal reflexes. No sensory deficit.  Skin: Skin is warm, dry and intact. No rash noted. She is not diaphoretic. No erythema. No pallor.  Psychiatric: She has a normal mood and affect. Her speech is normal and behavior is normal.  Nursing note and vitals reviewed.   ED Course  Procedures   DIAGNOSTIC STUDIES:  Oxygen Saturation is 100% on RA, normal by my interpretation.    COORDINATION OF CARE:  2:29 PM Discussed treatment plan with pt at bedside and pt agreed to plan.  Labs Review Labs Reviewed  I-STAT BETA HCG BLOOD, ED ([facility], [facility], [facility] ONLY)    Imaging Review Dg Lumbar Spine Complete  05/03/2015  CLINICAL DATA:  complaining of constant gradually worsening lower back pain x ~ 2 days. She notes her pain began after she stood from the couch ~ 2 days ago. She notes her pain was very mild at onset. She also describes a shooting pain in her back. She has taken ibuprofen, tramadol, and goody's without relief. Her pain is exacerbated with movement. She denies obvious injury and h/o similar pain. She also denies bowel/bladder incontinence, numbness/weakness, abdominal pain and vomiting. EXAM: LUMBAR SPINE - COMPLETE 4+ VIEW COMPARISON:  01/05/2007 FINDINGS: No fracture.  No spondylolisthesis. Mild loss disc height at L4-L5. Remaining disc spaces are well preserved. Facet joints are unremarkable. Normal soft tissues. IMPRESSION: 1. No fracture or acute finding.  Mild loss of disc height at L4-L5. Electronically Signed   By: Amie Portland M.D.   On: 05/03/2015 15:53   I have personally reviewed and evaluated these images and lab  results as part of my medical decision-making.   MDM   Final diagnoses:  Back pain, unspecified location    30 year old female presents with low back pain, which she states started 2 days ago after she stood up from sitting down. Denies numbness, weakness, paresthesia, bowel or bladder incontinence, saddle anesthesia, abdominal pain, nausea, vomiting. Patient is afebrile. Vital signs stable. On exam, she has TTP to her lumbar spine and right lumbar paraspinal muscles. No palpable step-off or deformity. Strength, sensation, DTRs intact. Patient given pain medication and muscle relaxant. Imaging of lumbar spine negative for fracture or acute finding, remarkable for mild loss of disc height at L4-L5. Discussed findings with patient. She is nontoxic and well-appearing, feel she is stable for discharge at this time. Symptoms likely muscular, weight likely contributing to this. Will treat with anti-inflammatory, muscle relaxant, and short course of pain medication. Patient to follow-up with PCP. Return precautions discussed. Patient verbalizes her understanding and is in agreement with plan.  BP 150/88 mmHg  Pulse 81  Temp(Src) 97.8 F (36.6 C) (Oral)  Resp  18  SpO2 100%  LMP 04/18/2015  I personally performed the services described in this documentation, which was scribed in my presence. The recorded information has been reviewed and is accurate.     Mady Gemma, PA-C 05/03/15 1616  Pricilla Loveless, MD 05/04/15 [phone]

## 2015-05-03 NOTE — ED Notes (Signed)
Pt c/o lower back pain that started three days ago. "It's like I be walkin, and it'll paralyze me and I'll fall to the ground." States it's like a back spasm. Denies any abd pain, difficulty/pain with urination. Center of lower back, states last period x 2 weeks ago

## 2015-05-03 NOTE — Discharge Instructions (Signed)
1. Medications: ibuprofen, robaxin, vicodin, usual home medications 2. Treatment: rest, drink plenty of fluids 3. Follow Up: please followup with your primary doctor in 3-5 days for discussion of your diagnoses and further evaluation after today's visit; if you do not have a primary care doctor use the phone number listed in your discharge paperwork to find one; please return to the ER for increased pain, numbness, weakness, loss of control of your bowel or bladder, new or worsening symptoms   Back Pain, Adult Back pain is very common in adults.The cause of back pain is rarely dangerous and the pain often gets better over time.The cause of your back pain may not be known. Some common causes of back pain include:  Strain of the muscles or ligaments supporting the spine.  Wear and tear (degeneration) of the spinal disks.  Arthritis.  Direct injury to the back. For many people, back pain may return. Since back pain is rarely dangerous, most people can learn to manage this condition on their own. HOME CARE INSTRUCTIONS Watch your back pain for any changes. The following actions may help to lessen any discomfort you are feeling:  Remain active. It is stressful on your back to sit or stand in one place for long periods of time. Do not sit, drive, or stand in one place for more than 30 minutes at a time. Take short walks on even surfaces as soon as you are able.Try to increase the length of time you walk each day.  Exercise regularly as directed by your health care provider. Exercise helps your back heal faster. It also helps avoid future injury by keeping your muscles strong and flexible.  Do not stay in bed.Resting more than 1-2 days can delay your recovery.  Pay attention to your body when you bend and lift. The most comfortable positions are those that put less stress on your recovering back. Always use proper lifting techniques, including:  Bending your knees.  Keeping the load close  to your body.  Avoiding twisting.  Find a comfortable position to sleep. Use a firm mattress and lie on your side with your knees slightly bent. If you lie on your back, put a pillow under your knees.  Avoid feeling anxious or stressed.Stress increases muscle tension and can worsen back pain.It is important to recognize when you are anxious or stressed and learn ways to manage it, such as with exercise.  Take medicines only as directed by your health care provider. Over-the-counter medicines to reduce pain and inflammation are often the most helpful.Your health care provider may prescribe muscle relaxant drugs.These medicines help dull your pain so you can more quickly return to your normal activities and healthy exercise.  Apply ice to the injured area:  Put ice in a plastic bag.  Place a towel between your skin and the bag.  Leave the ice on for 20 minutes, 2-3 times a day for the first 2-3 days. After that, ice and heat may be alternated to reduce pain and spasms.  Maintain a healthy weight. Excess weight puts extra stress on your back and makes it difficult to maintain good posture. SEEK MEDICAL CARE IF:  You have pain that is not relieved with rest or medicine.  You have increasing pain going down into the legs or buttocks.  You have pain that does not improve in one week.  You have night pain.  You lose weight.  You have a fever or chills. SEEK IMMEDIATE MEDICAL CARE IF:  You develop new bowel or bladder control problems.  You have unusual weakness or numbness in your arms or legs.  You develop nausea or vomiting.  You develop abdominal pain.  You feel faint.   This information is not intended to replace advice given to you by your health care provider. Make sure you discuss any questions you have with your health care provider.   Document Released: 12/23/2004 Document Revised: 01/13/2014 Document Reviewed: 04/26/2013 Elsevier Interactive Patient Education  [employer]

## 2015-09-10 ENCOUNTER — Encounter (HOSPITAL_COMMUNITY): Payer: Self-pay | Admitting: Emergency Medicine

## 2015-09-10 ENCOUNTER — Emergency Department (HOSPITAL_COMMUNITY)
Admission: EM | Admit: 2015-09-10 | Discharge: 2015-09-10 | Disposition: A | Discharge status: Home / Self Care | Payer: Self-pay | Attending: Emergency Medicine | Admitting: Emergency Medicine

## 2015-09-10 ENCOUNTER — Emergency Department (HOSPITAL_COMMUNITY): Payer: Self-pay

## 2015-09-10 DIAGNOSIS — X58XXXA Exposure to other specified factors, initial encounter: Secondary | ICD-10-CM | POA: Insufficient documentation

## 2015-09-10 DIAGNOSIS — M25561 Pain in right knee: Secondary | ICD-10-CM | POA: Insufficient documentation

## 2015-09-10 DIAGNOSIS — Y999 Unspecified external cause status: Secondary | ICD-10-CM | POA: Insufficient documentation

## 2015-09-10 DIAGNOSIS — Z79899 Other long term (current) drug therapy: Secondary | ICD-10-CM | POA: Insufficient documentation

## 2015-09-10 DIAGNOSIS — Y92000 Kitchen of unspecified non-institutional (private) residence as  the place of occurrence of the external cause: Secondary | ICD-10-CM | POA: Insufficient documentation

## 2015-09-10 DIAGNOSIS — Y93E5 Activity, floor mopping and cleaning: Secondary | ICD-10-CM | POA: Insufficient documentation

## 2015-09-10 DIAGNOSIS — F17210 Nicotine dependence, cigarettes, uncomplicated: Secondary | ICD-10-CM | POA: Insufficient documentation

## 2015-09-10 MED ORDER — HYDROCODONE-ACETAMINOPHEN 5-325 MG PO TABS: 1.0000 | ORAL_TABLET | Freq: Four times a day (QID) | ORAL | 0 refills | Status: AC | PRN

## 2015-09-10 MED ORDER — MELOXICAM 15 MG PO TABS: 15.0000 mg | ORAL_TABLET | Freq: Every day | ORAL | 0 refills | Status: AC

## 2015-09-10 MED ORDER — HYDROCODONE-ACETAMINOPHEN 5-325 MG PO TABS
1.0000 | ORAL_TABLET | Freq: Once | ORAL | Status: AC
  Administered 2015-09-10: 1 via ORAL
  Filled 2015-09-10: qty 1

## 2015-09-10 NOTE — ED Notes (Signed)
Called once for room placement no answer.

## 2015-09-10 NOTE — Discharge Instructions (Signed)
Ice and elevate your leg. Mobic for pain and inflammation. Norco for severe pain. Follow up with family doctor or orthopedics specialist if not improving.

## 2015-09-10 NOTE — ED Triage Notes (Signed)
Pt presents to ED with complaints of right knee pain which began this morning. Pt states this may have been caused from kneeling while cleaning last night. Pt says this morning the pain started. States "it feels like I need to pop it, it feels tight."

## 2015-09-10 NOTE — ED Provider Notes (Signed)
[facility]-EMERGENCY DEPT Provider Note   CSN: [id number] Arrival date & time: 09/10/15  1417  By signing my name below, I, Linna DarnerRussell Turner, attest that this documentation has been prepared under the direction and in the presence of non-physician practitioner, Jaynie Crumbleatyana Alonte Wulff, PA-C. Electronically Signed: Linna Darnerussell Turner, Scribe. 09/10/2015. 3:22 PM.  History   Chief Complaint Chief Complaint  Patient presents with  . Knee Pain    The history is provided by the patient. No language interpreter was used.     HPI Comments: Monica Mcbride is a 30 y.o. female who presents to the Emergency Department complaining of sudden onset, constant, severe, right knee pain beginning this morning. Pt reports she was kneeling down cleaning her kitchen last night and her pain presented when she woke up this morning. She states her pain is non-radiating. Pt endorses severe pain exacerbation with flexion and states she has not ambulated much because it feels like her right knee is going to "give out" with weight bearing and ambulation. She denies right knee pain exacerbation with right ankle movement. Pt denies h/o problems with her right knee, right knee injury, or recent falls. She denies experiencing similar pain in the past. She further denies right calf pain, numbness, neuro deficits, or any other associated symptoms. Pt does not have a PCP.  Past Medical History:  Diagnosis Date  . Obesity     There are no active problems to display for this patient.   Past Surgical History:  Procedure Laterality Date  . CESAREAN SECTION      OB History    Gravida Para Term Preterm AB Living   1         1   SAB TAB Ectopic Multiple Live Births                   Home Medications    Prior to Admission medications   Medication Sig Start Date End Date Taking? Authorizing Provider  Aspirin-Acetaminophen-Caffeine (GOODYS EXTRA STRENGTH) 520-260-32.5 MG PACK Take 1 packet by mouth daily as needed. Headache.     Historical Provider, MD  HYDROcodone-acetaminophen (NORCO/VICODIN) 5-325 MG tablet Take 1-2 tablets by mouth every 4 (four) hours as needed. 05/03/15   Mady GemmaElizabeth C Westfall, PA-C  ibuprofen (ADVIL,MOTRIN) 600 MG tablet Take 1 tablet (600 mg total) by mouth every 6 (six) hours as needed. 05/03/15   Mady GemmaElizabeth C Westfall, PA-C  megestrol (MEGACE) 20 MG tablet Take 1 tablet (20 mg total) by mouth daily. Take two tablets (40 mg) three times per day times three days,  then take two tablets (40 mg) two times per day times three days,  then take two tablets (40 mg)once per day Patient not taking: Reported on 10/10/2014 05/13/14   Duane LopeJennifer I Rasch, NP  methocarbamol (ROBAXIN) 500 MG tablet Take 1 tablet (500 mg total) by mouth 2 (two) times daily. 05/03/15   Mady GemmaElizabeth C Westfall, PA-C    Family History No family history on file.  Social History Social History  Substance Use Topics  . Smoking status: Current Every Day Smoker    Packs/day: 1.00    Years: 15.00    Types: Cigarettes  . Smokeless tobacco: Never Used  . Alcohol use Yes     Allergies   Review of patient's allergies indicates no known allergies.   Review of Systems Review of Systems  Musculoskeletal: Positive for arthralgias (right knee). Negative for myalgias (right calf).  Skin: Negative for wound.  Neurological: Negative for numbness.  Negative for sensation loss.    Physical Exam Updated Vital Signs BP 122/90 (BP Location: Left Arm)   Pulse 92   Temp 97.7 F (36.5 C) (Oral)   Resp 18   Ht 5\' 7"  (1.702 m)   Wt (!) 357 lb (161.9 kg)   LMP 09/09/2015 (Exact Date)   SpO2 95%   BMI 55.91 kg/m   Physical Exam  Constitutional: She is oriented to person, place, and time. She appears well-developed and well-nourished. No distress.  HENT:  Head: Normocephalic and atraumatic.  Eyes: Conjunctivae and EOM are normal.  Neck: Neck supple. No tracheal deviation present.  Cardiovascular: Normal rate.   Pulmonary/Chest:  Effort normal. No respiratory distress.  Musculoskeletal: Normal range of motion.  Exam is limited by body habitus. Tenderness to palpation over right medial knee. No obvious swelling or deformity noted. Pain with ROM. Full rom of the knee. Negative anterior and posterior drawer sign. No laxity with medial or lateral stress  Neurological: She is alert and oriented to person, place, and time.  Skin: Skin is warm and dry. Capillary refill takes less than 2 seconds.  Psychiatric: She has a normal mood and affect. Her behavior is normal.  Nursing note and vitals reviewed.   ED Treatments / Results  Labs (all labs ordered are listed, but only abnormal results are displayed) Labs Reviewed - No data to display  EKG  EKG Interpretation None       Radiology Dg Knee Complete 4 Views Right  Result Date: 09/10/2015 CLINICAL DATA:  Right all over knee pain, especially anteriorly, today with no known injury. Pt stated she was down on her knees last night cleaning for about 15-20 minutes. No previous injury EXAM: RIGHT KNEE - COMPLETE 4+ VIEW COMPARISON:  None. FINDINGS: No evidence of fracture, dislocation, or joint effusion. No evidence of arthropathy or other focal bone abnormality. Soft tissues are unremarkable. IMPRESSION: Negative. Electronically Signed   By: Norva Pavlov M.D.   On: 09/10/2015 15:05    Procedures Procedures (including critical care time)  DIAGNOSTIC STUDIES: Oxygen Saturation is 95% on RA, adequate by my interpretation.    COORDINATION OF CARE: 3:22 PM Discussed treatment plan with pt at bedside and pt agreed to plan.  Medications Ordered in ED Medications - No data to display   Initial Impression / Assessment and Plan / ED Course  I have reviewed the triage vital signs and the nursing notes.  Pertinent labs & imaging results that were available during my care of the patient were reviewed by me and considered in my medical decision making (see chart for  details).  Clinical Course   Patient with atraumatic right knee pain. Exam is very difficult due to body habitus. X-rays negative. Joint is stable. Patient reports some locking and popping sensation. Question meniscal injury. We'll start her NSAIDs, 6 tablets of Norco for severe pain, crutches provided, Ace wrap to the knee, follow-up with orthopedic specialist. Knee exercises and discharge instructions provided    Final Clinical Impressions(s) / ED Diagnoses   Final diagnoses:  Knee pain, right    New Prescriptions Discharge Medication List as of 09/10/2015  4:02 PM    START taking these medications   Details  meloxicam (MOBIC) 15 MG tablet Take 1 tablet (15 mg total) by mouth daily., Starting Mon 09/10/2015, Print         Jaynie Crumble, PA-C 09/10/15 2137    Derwood Kaplan, MD 09/11/15 0025

## 2015-09-10 NOTE — ED Notes (Signed)
Ortho tech paged for bariatric crutches

## 2017-08-28 IMAGING — CR DG KNEE COMPLETE 4+V*R*
4 series · 4 of 4 positions shown · non-contrast
Comparison: None.

CLINICAL DATA: Right all over knee pain, especially anteriorly,
today with no known injury. Pt stated she was down on her knees last
night cleaning for about 15-20 minutes. No previous injury

EXAM:
RIGHT KNEE - COMPLETE 4+ VIEW

[t knee ap right]
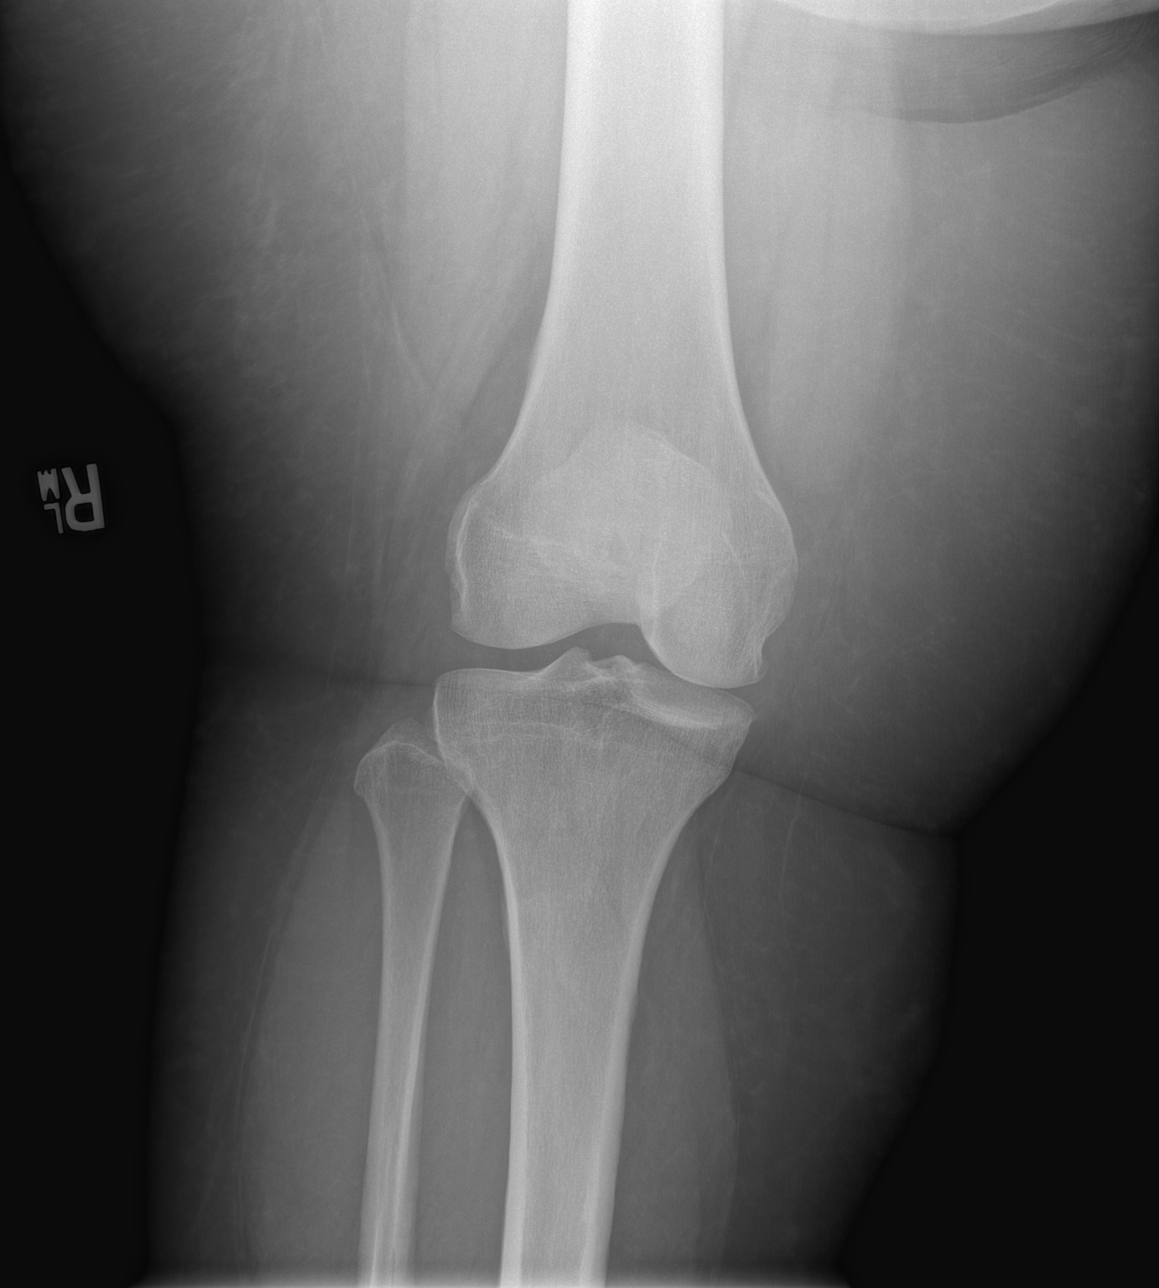

[t knee obl right (1 of 2)]
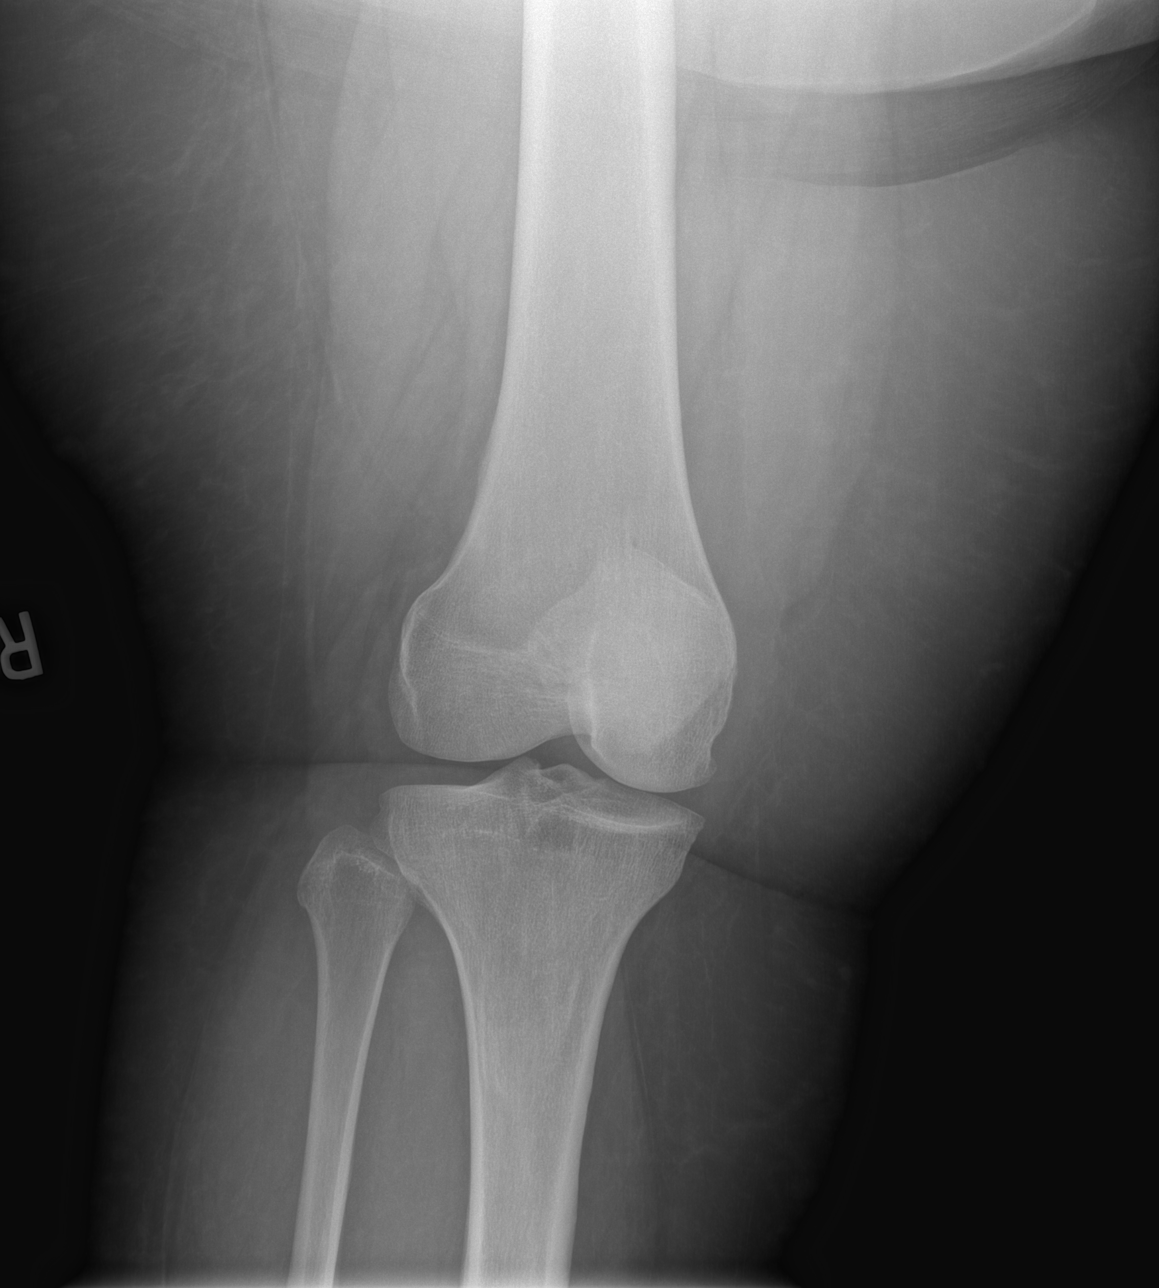

[t knee obl right (2 of 2)]
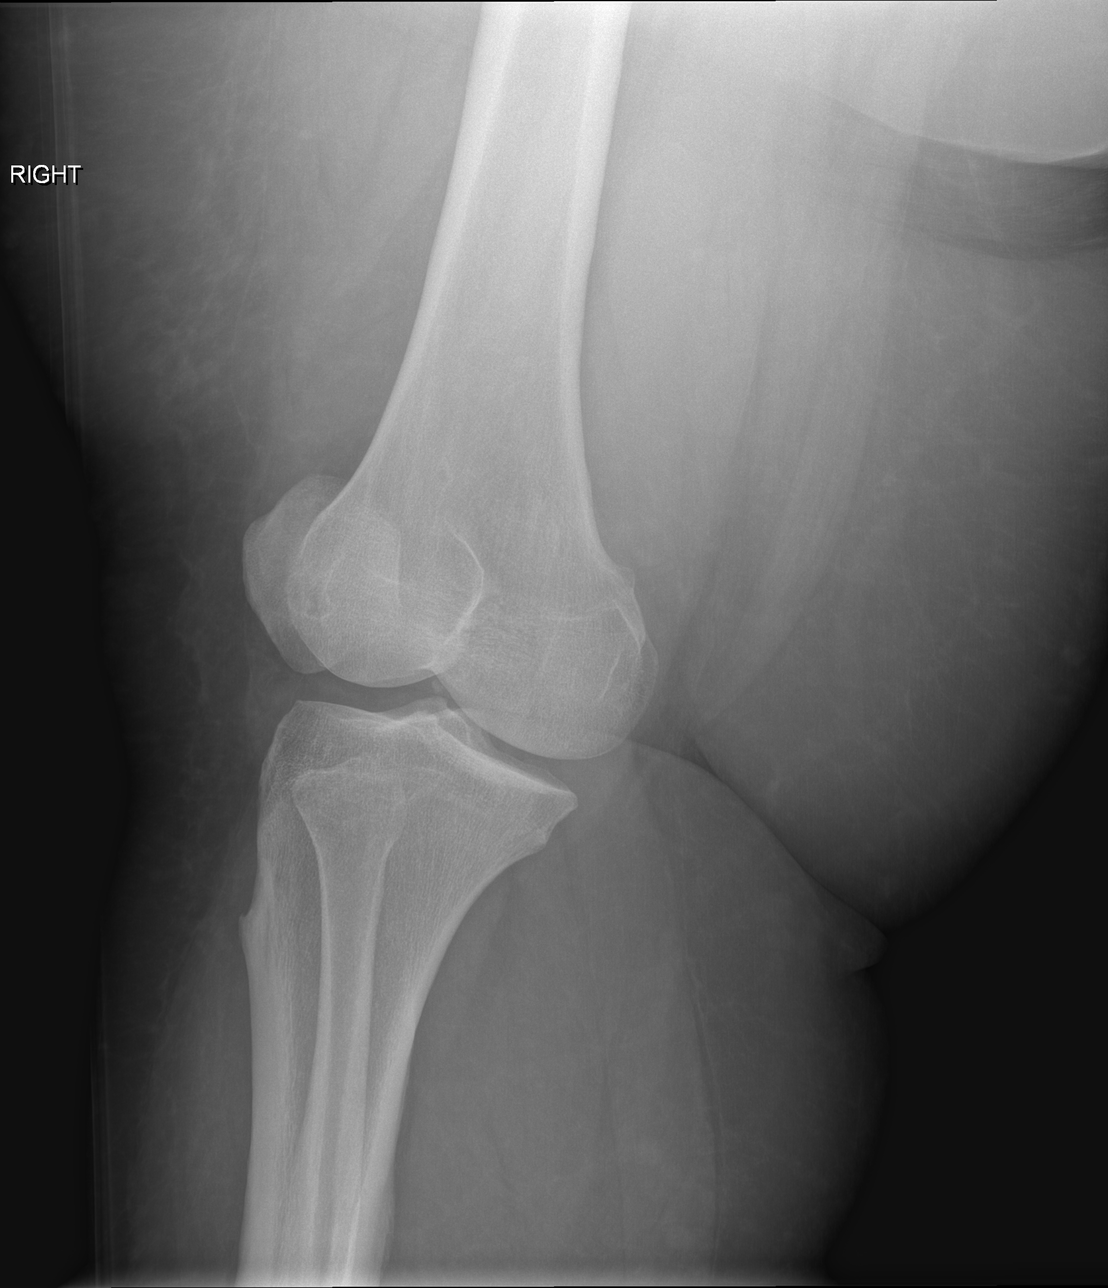

[x knee lat right]
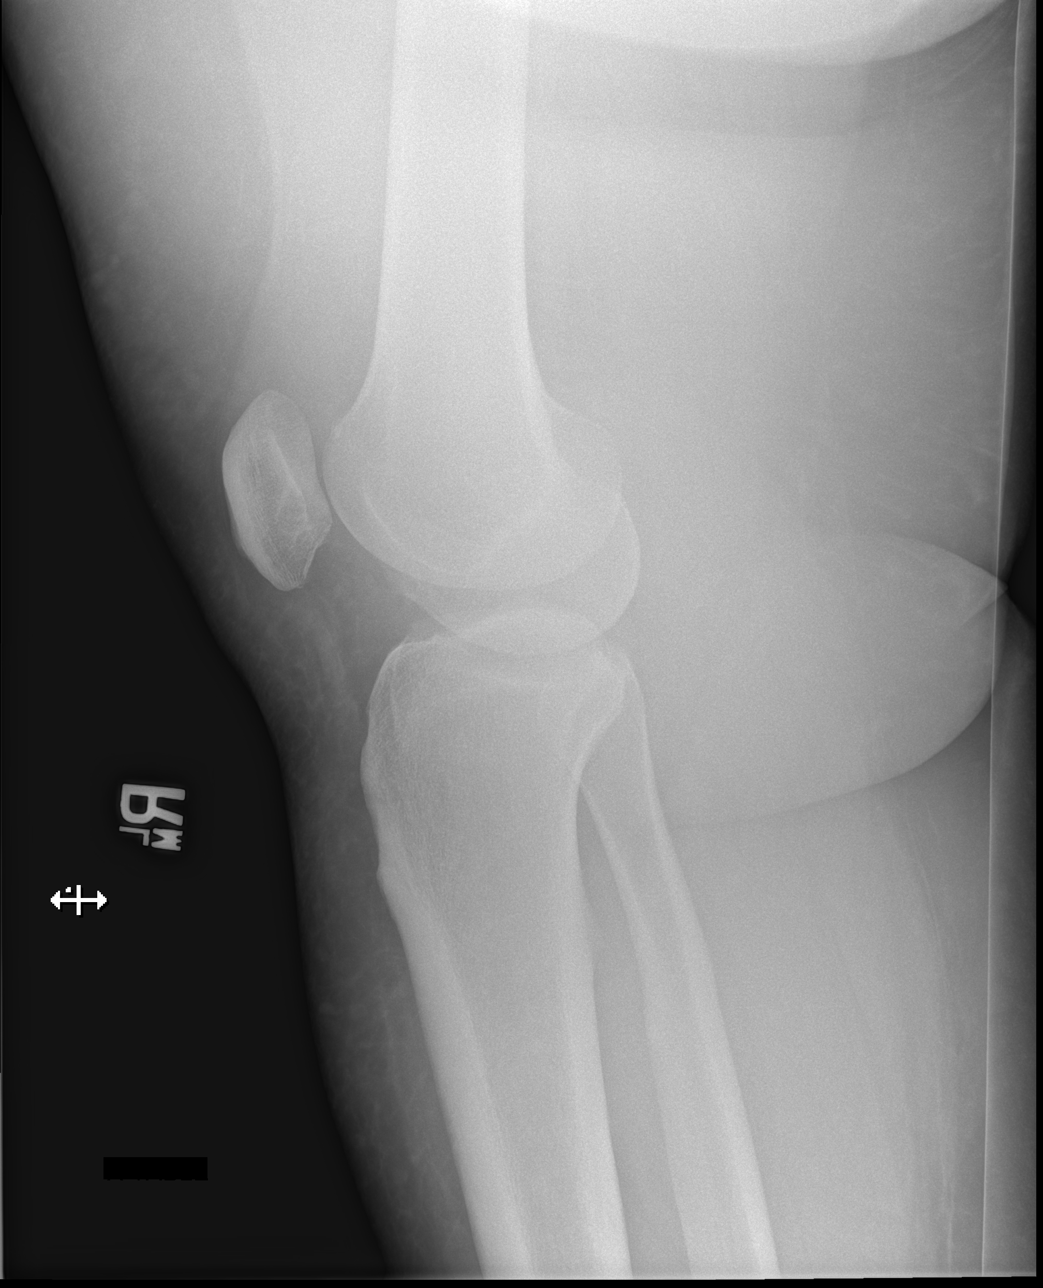

[4 of 4 positions shown; findings below may reference images not displayed]

FINDINGS: No evidence of fracture, dislocation, or joint effusion. No evidence
of arthropathy or other focal bone abnormality. Soft tissues are
unremarkable.
IMPRESSION: Negative.
